# Patient Record
Sex: Male | Born: 1940 | Race: Black or African American | Hispanic: No | Marital: Single
Health system: Southern US, Community
[De-identification: ages and names within clinical notes are randomized; demographics above are authoritative.]

---

## 2015-02-17 DIAGNOSIS — I10 Essential (primary) hypertension: Secondary | ICD-10-CM | POA: Diagnosis not present

## 2015-02-17 DIAGNOSIS — R69 Illness, unspecified: Secondary | ICD-10-CM | POA: Diagnosis not present

## 2015-02-17 DIAGNOSIS — Z23 Encounter for immunization: Secondary | ICD-10-CM | POA: Diagnosis not present

## 2015-05-19 DIAGNOSIS — I10 Essential (primary) hypertension: Secondary | ICD-10-CM | POA: Diagnosis not present

## 2015-08-18 DIAGNOSIS — N182 Chronic kidney disease, stage 2 (mild): Secondary | ICD-10-CM | POA: Diagnosis not present

## 2015-08-18 DIAGNOSIS — R739 Hyperglycemia, unspecified: Secondary | ICD-10-CM | POA: Diagnosis not present

## 2015-08-18 DIAGNOSIS — R69 Illness, unspecified: Secondary | ICD-10-CM | POA: Diagnosis not present

## 2015-08-18 DIAGNOSIS — C61 Malignant neoplasm of prostate: Secondary | ICD-10-CM | POA: Diagnosis not present

## 2015-08-18 DIAGNOSIS — I10 Essential (primary) hypertension: Secondary | ICD-10-CM | POA: Diagnosis not present

## 2015-11-02 DIAGNOSIS — Z23 Encounter for immunization: Secondary | ICD-10-CM | POA: Diagnosis not present

## 2015-11-02 DIAGNOSIS — R739 Hyperglycemia, unspecified: Secondary | ICD-10-CM | POA: Diagnosis not present

## 2015-11-02 DIAGNOSIS — Z0000 Encounter for general adult medical examination without abnormal findings: Secondary | ICD-10-CM | POA: Diagnosis not present

## 2015-11-02 DIAGNOSIS — R69 Illness, unspecified: Secondary | ICD-10-CM | POA: Diagnosis not present

## 2015-11-02 DIAGNOSIS — C61 Malignant neoplasm of prostate: Secondary | ICD-10-CM | POA: Diagnosis not present

## 2015-11-02 DIAGNOSIS — Z0001 Encounter for general adult medical examination with abnormal findings: Secondary | ICD-10-CM | POA: Diagnosis not present

## 2015-11-02 DIAGNOSIS — I10 Essential (primary) hypertension: Secondary | ICD-10-CM | POA: Diagnosis not present

## 2015-11-03 DIAGNOSIS — T464X5A Adverse effect of angiotensin-converting-enzyme inhibitors, initial encounter: Secondary | ICD-10-CM | POA: Diagnosis not present

## 2015-11-03 DIAGNOSIS — R918 Other nonspecific abnormal finding of lung field: Secondary | ICD-10-CM | POA: Diagnosis not present

## 2015-11-03 DIAGNOSIS — D72829 Elevated white blood cell count, unspecified: Secondary | ICD-10-CM | POA: Diagnosis not present

## 2015-11-03 DIAGNOSIS — Z1211 Encounter for screening for malignant neoplasm of colon: Secondary | ICD-10-CM | POA: Diagnosis not present

## 2015-11-03 DIAGNOSIS — E875 Hyperkalemia: Secondary | ICD-10-CM | POA: Diagnosis not present

## 2015-11-03 DIAGNOSIS — I10 Essential (primary) hypertension: Secondary | ICD-10-CM | POA: Diagnosis not present

## 2015-11-03 DIAGNOSIS — N179 Acute kidney failure, unspecified: Secondary | ICD-10-CM | POA: Diagnosis not present

## 2015-11-04 DIAGNOSIS — N179 Acute kidney failure, unspecified: Secondary | ICD-10-CM | POA: Diagnosis not present

## 2015-11-04 DIAGNOSIS — E876 Hypokalemia: Secondary | ICD-10-CM | POA: Diagnosis not present

## 2015-11-04 DIAGNOSIS — D72829 Elevated white blood cell count, unspecified: Secondary | ICD-10-CM | POA: Diagnosis not present

## 2015-11-05 DIAGNOSIS — I10 Essential (primary) hypertension: Secondary | ICD-10-CM | POA: Diagnosis not present

## 2015-11-05 DIAGNOSIS — N179 Acute kidney failure, unspecified: Secondary | ICD-10-CM | POA: Diagnosis not present

## 2015-11-05 DIAGNOSIS — E875 Hyperkalemia: Secondary | ICD-10-CM | POA: Diagnosis not present

## 2015-11-07 DIAGNOSIS — E876 Hypokalemia: Secondary | ICD-10-CM | POA: Diagnosis not present

## 2015-11-08 DIAGNOSIS — E875 Hyperkalemia: Secondary | ICD-10-CM | POA: Diagnosis not present

## 2015-11-08 DIAGNOSIS — N183 Chronic kidney disease, stage 3 (moderate): Secondary | ICD-10-CM | POA: Diagnosis not present

## 2015-11-08 DIAGNOSIS — I10 Essential (primary) hypertension: Secondary | ICD-10-CM | POA: Diagnosis not present

## 2015-11-18 DIAGNOSIS — N183 Chronic kidney disease, stage 3 (moderate): Secondary | ICD-10-CM | POA: Diagnosis not present

## 2015-11-18 DIAGNOSIS — D509 Iron deficiency anemia, unspecified: Secondary | ICD-10-CM | POA: Diagnosis not present

## 2015-11-18 DIAGNOSIS — Z79899 Other long term (current) drug therapy: Secondary | ICD-10-CM | POA: Diagnosis not present

## 2015-11-22 DIAGNOSIS — N179 Acute kidney failure, unspecified: Secondary | ICD-10-CM | POA: Diagnosis not present

## 2015-11-22 DIAGNOSIS — D649 Anemia, unspecified: Secondary | ICD-10-CM | POA: Diagnosis not present

## 2015-11-22 DIAGNOSIS — E875 Hyperkalemia: Secondary | ICD-10-CM | POA: Diagnosis not present

## 2015-11-22 DIAGNOSIS — I10 Essential (primary) hypertension: Secondary | ICD-10-CM | POA: Diagnosis not present

## 2015-12-06 DIAGNOSIS — C61 Malignant neoplasm of prostate: Secondary | ICD-10-CM | POA: Diagnosis not present

## 2015-12-06 DIAGNOSIS — R69 Illness, unspecified: Secondary | ICD-10-CM | POA: Diagnosis not present

## 2015-12-06 DIAGNOSIS — I10 Essential (primary) hypertension: Secondary | ICD-10-CM | POA: Diagnosis not present

## 2015-12-06 DIAGNOSIS — N183 Chronic kidney disease, stage 3 (moderate): Secondary | ICD-10-CM | POA: Diagnosis not present

## 2015-12-06 DIAGNOSIS — R739 Hyperglycemia, unspecified: Secondary | ICD-10-CM | POA: Diagnosis not present

## 2015-12-06 DIAGNOSIS — Z0000 Encounter for general adult medical examination without abnormal findings: Secondary | ICD-10-CM | POA: Diagnosis not present

## 2015-12-12 DIAGNOSIS — I129 Hypertensive chronic kidney disease with stage 1 through stage 4 chronic kidney disease, or unspecified chronic kidney disease: Secondary | ICD-10-CM | POA: Diagnosis not present

## 2015-12-12 DIAGNOSIS — D509 Iron deficiency anemia, unspecified: Secondary | ICD-10-CM | POA: Diagnosis not present

## 2015-12-12 DIAGNOSIS — N183 Chronic kidney disease, stage 3 (moderate): Secondary | ICD-10-CM | POA: Diagnosis not present

## 2015-12-12 DIAGNOSIS — E559 Vitamin D deficiency, unspecified: Secondary | ICD-10-CM | POA: Diagnosis not present

## 2015-12-12 DIAGNOSIS — N189 Chronic kidney disease, unspecified: Secondary | ICD-10-CM | POA: Diagnosis not present

## 2015-12-12 DIAGNOSIS — R809 Proteinuria, unspecified: Secondary | ICD-10-CM | POA: Diagnosis not present

## 2015-12-12 DIAGNOSIS — Z1159 Encounter for screening for other viral diseases: Secondary | ICD-10-CM | POA: Diagnosis not present

## 2015-12-12 DIAGNOSIS — R1901 Right upper quadrant abdominal swelling, mass and lump: Secondary | ICD-10-CM | POA: Diagnosis not present

## 2015-12-12 DIAGNOSIS — Z79899 Other long term (current) drug therapy: Secondary | ICD-10-CM | POA: Diagnosis not present

## 2015-12-27 DIAGNOSIS — E872 Acidosis: Secondary | ICD-10-CM | POA: Diagnosis not present

## 2015-12-27 DIAGNOSIS — E875 Hyperkalemia: Secondary | ICD-10-CM | POA: Diagnosis not present

## 2015-12-27 DIAGNOSIS — I10 Essential (primary) hypertension: Secondary | ICD-10-CM | POA: Diagnosis not present

## 2015-12-27 DIAGNOSIS — N183 Chronic kidney disease, stage 3 (moderate): Secondary | ICD-10-CM | POA: Diagnosis not present

## 2016-01-19 DIAGNOSIS — R3129 Other microscopic hematuria: Secondary | ICD-10-CM | POA: Diagnosis not present

## 2016-01-19 DIAGNOSIS — N281 Cyst of kidney, acquired: Secondary | ICD-10-CM | POA: Diagnosis not present

## 2016-01-19 DIAGNOSIS — R69 Illness, unspecified: Secondary | ICD-10-CM | POA: Diagnosis not present

## 2016-01-19 DIAGNOSIS — R1901 Right upper quadrant abdominal swelling, mass and lump: Secondary | ICD-10-CM | POA: Diagnosis not present

## 2016-01-25 DIAGNOSIS — N281 Cyst of kidney, acquired: Secondary | ICD-10-CM | POA: Diagnosis not present

## 2016-01-25 DIAGNOSIS — D3501 Benign neoplasm of right adrenal gland: Secondary | ICD-10-CM | POA: Diagnosis not present

## 2016-01-25 DIAGNOSIS — R918 Other nonspecific abnormal finding of lung field: Secondary | ICD-10-CM | POA: Diagnosis not present

## 2016-01-25 DIAGNOSIS — D3502 Benign neoplasm of left adrenal gland: Secondary | ICD-10-CM | POA: Diagnosis not present

## 2016-01-27 DIAGNOSIS — R8290 Unspecified abnormal findings in urine: Secondary | ICD-10-CM | POA: Diagnosis not present

## 2016-01-27 DIAGNOSIS — R1901 Right upper quadrant abdominal swelling, mass and lump: Secondary | ICD-10-CM | POA: Diagnosis not present

## 2016-01-27 DIAGNOSIS — R3129 Other microscopic hematuria: Secondary | ICD-10-CM | POA: Diagnosis not present

## 2016-01-27 DIAGNOSIS — D494 Neoplasm of unspecified behavior of bladder: Secondary | ICD-10-CM | POA: Diagnosis not present

## 2016-01-30 DIAGNOSIS — R222 Localized swelling, mass and lump, trunk: Secondary | ICD-10-CM | POA: Diagnosis not present

## 2016-02-01 DIAGNOSIS — R69 Illness, unspecified: Secondary | ICD-10-CM | POA: Diagnosis not present

## 2016-02-01 DIAGNOSIS — I10 Essential (primary) hypertension: Secondary | ICD-10-CM | POA: Diagnosis not present

## 2016-02-01 DIAGNOSIS — Z0000 Encounter for general adult medical examination without abnormal findings: Secondary | ICD-10-CM | POA: Diagnosis not present

## 2016-02-01 DIAGNOSIS — N183 Chronic kidney disease, stage 3 (moderate): Secondary | ICD-10-CM | POA: Diagnosis not present

## 2016-02-01 DIAGNOSIS — R739 Hyperglycemia, unspecified: Secondary | ICD-10-CM | POA: Diagnosis not present

## 2016-02-01 DIAGNOSIS — C61 Malignant neoplasm of prostate: Secondary | ICD-10-CM | POA: Diagnosis not present

## 2016-02-02 DIAGNOSIS — I129 Hypertensive chronic kidney disease with stage 1 through stage 4 chronic kidney disease, or unspecified chronic kidney disease: Secondary | ICD-10-CM | POA: Diagnosis not present

## 2016-02-02 DIAGNOSIS — R69 Illness, unspecified: Secondary | ICD-10-CM | POA: Diagnosis not present

## 2016-02-02 DIAGNOSIS — N189 Chronic kidney disease, unspecified: Secondary | ICD-10-CM | POA: Diagnosis not present

## 2016-02-02 DIAGNOSIS — F17200 Nicotine dependence, unspecified, uncomplicated: Secondary | ICD-10-CM | POA: Diagnosis not present

## 2016-02-02 DIAGNOSIS — Z79899 Other long term (current) drug therapy: Secondary | ICD-10-CM | POA: Diagnosis not present

## 2016-02-02 DIAGNOSIS — E870 Hyperosmolality and hypernatremia: Secondary | ICD-10-CM | POA: Diagnosis not present

## 2016-02-09 DIAGNOSIS — D3614 Benign neoplasm of peripheral nerves and autonomic nervous system of thorax: Secondary | ICD-10-CM | POA: Diagnosis not present

## 2016-02-09 DIAGNOSIS — D494 Neoplasm of unspecified behavior of bladder: Secondary | ICD-10-CM | POA: Diagnosis not present

## 2016-02-09 DIAGNOSIS — C672 Malignant neoplasm of lateral wall of bladder: Secondary | ICD-10-CM | POA: Diagnosis not present

## 2016-02-09 DIAGNOSIS — C679 Malignant neoplasm of bladder, unspecified: Secondary | ICD-10-CM | POA: Diagnosis not present

## 2016-02-09 DIAGNOSIS — R3129 Other microscopic hematuria: Secondary | ICD-10-CM | POA: Diagnosis not present

## 2016-02-09 DIAGNOSIS — R222 Localized swelling, mass and lump, trunk: Secondary | ICD-10-CM | POA: Diagnosis not present

## 2016-02-09 DIAGNOSIS — D485 Neoplasm of uncertain behavior of skin: Secondary | ICD-10-CM | POA: Diagnosis not present

## 2016-02-09 DIAGNOSIS — I10 Essential (primary) hypertension: Secondary | ICD-10-CM | POA: Diagnosis not present

## 2016-02-09 DIAGNOSIS — R69 Illness, unspecified: Secondary | ICD-10-CM | POA: Diagnosis not present

## 2016-02-09 DIAGNOSIS — D213 Benign neoplasm of connective and other soft tissue of thorax: Secondary | ICD-10-CM | POA: Diagnosis not present

## 2016-02-09 DIAGNOSIS — Z79899 Other long term (current) drug therapy: Secondary | ICD-10-CM | POA: Diagnosis not present

## 2016-03-07 DIAGNOSIS — C672 Malignant neoplasm of lateral wall of bladder: Secondary | ICD-10-CM | POA: Diagnosis not present

## 2016-03-07 DIAGNOSIS — D494 Neoplasm of unspecified behavior of bladder: Secondary | ICD-10-CM | POA: Diagnosis not present

## 2016-03-07 DIAGNOSIS — I10 Essential (primary) hypertension: Secondary | ICD-10-CM | POA: Diagnosis not present

## 2016-03-07 DIAGNOSIS — Z79899 Other long term (current) drug therapy: Secondary | ICD-10-CM | POA: Diagnosis not present

## 2016-03-07 DIAGNOSIS — N3090 Cystitis, unspecified without hematuria: Secondary | ICD-10-CM | POA: Diagnosis not present

## 2016-03-07 DIAGNOSIS — R69 Illness, unspecified: Secondary | ICD-10-CM | POA: Diagnosis not present

## 2016-03-22 DIAGNOSIS — C672 Malignant neoplasm of lateral wall of bladder: Secondary | ICD-10-CM | POA: Diagnosis not present

## 2016-03-26 DIAGNOSIS — T8131XA Disruption of external operation (surgical) wound, not elsewhere classified, initial encounter: Secondary | ICD-10-CM | POA: Diagnosis not present

## 2016-03-26 DIAGNOSIS — D3610 Benign neoplasm of peripheral nerves and autonomic nervous system, unspecified: Secondary | ICD-10-CM | POA: Diagnosis not present

## 2016-03-29 DIAGNOSIS — Z79899 Other long term (current) drug therapy: Secondary | ICD-10-CM | POA: Diagnosis not present

## 2016-03-29 DIAGNOSIS — Z8249 Family history of ischemic heart disease and other diseases of the circulatory system: Secondary | ICD-10-CM | POA: Diagnosis not present

## 2016-03-29 DIAGNOSIS — T8131XA Disruption of external operation (surgical) wound, not elsewhere classified, initial encounter: Secondary | ICD-10-CM | POA: Diagnosis not present

## 2016-03-29 DIAGNOSIS — D3610 Benign neoplasm of peripheral nerves and autonomic nervous system, unspecified: Secondary | ICD-10-CM | POA: Diagnosis not present

## 2016-03-29 DIAGNOSIS — I10 Essential (primary) hypertension: Secondary | ICD-10-CM | POA: Diagnosis not present

## 2016-03-29 DIAGNOSIS — R69 Illness, unspecified: Secondary | ICD-10-CM | POA: Diagnosis not present

## 2016-03-29 DIAGNOSIS — Z8551 Personal history of malignant neoplasm of bladder: Secondary | ICD-10-CM | POA: Diagnosis not present

## 2016-03-29 DIAGNOSIS — Y838 Other surgical procedures as the cause of abnormal reaction of the patient, or of later complication, without mention of misadventure at the time of the procedure: Secondary | ICD-10-CM | POA: Diagnosis not present

## 2016-03-30 DIAGNOSIS — S2190XA Unspecified open wound of unspecified part of thorax, initial encounter: Secondary | ICD-10-CM | POA: Diagnosis not present

## 2016-03-30 DIAGNOSIS — T8131XA Disruption of external operation (surgical) wound, not elsewhere classified, initial encounter: Secondary | ICD-10-CM | POA: Diagnosis not present

## 2016-04-11 DIAGNOSIS — Z0000 Encounter for general adult medical examination without abnormal findings: Secondary | ICD-10-CM | POA: Diagnosis not present

## 2016-04-11 DIAGNOSIS — I10 Essential (primary) hypertension: Secondary | ICD-10-CM | POA: Diagnosis not present

## 2016-04-11 DIAGNOSIS — Z6821 Body mass index (BMI) 21.0-21.9, adult: Secondary | ICD-10-CM | POA: Diagnosis not present

## 2016-04-11 DIAGNOSIS — R69 Illness, unspecified: Secondary | ICD-10-CM | POA: Diagnosis not present

## 2016-04-11 DIAGNOSIS — Z972 Presence of dental prosthetic device (complete) (partial): Secondary | ICD-10-CM | POA: Diagnosis not present

## 2016-05-04 DIAGNOSIS — N183 Chronic kidney disease, stage 3 (moderate): Secondary | ICD-10-CM | POA: Diagnosis not present

## 2016-05-04 DIAGNOSIS — I10 Essential (primary) hypertension: Secondary | ICD-10-CM | POA: Diagnosis not present

## 2016-05-04 DIAGNOSIS — C61 Malignant neoplasm of prostate: Secondary | ICD-10-CM | POA: Diagnosis not present

## 2016-05-04 DIAGNOSIS — R739 Hyperglycemia, unspecified: Secondary | ICD-10-CM | POA: Diagnosis not present

## 2016-05-04 DIAGNOSIS — R69 Illness, unspecified: Secondary | ICD-10-CM | POA: Diagnosis not present

## 2016-05-04 DIAGNOSIS — Z0000 Encounter for general adult medical examination without abnormal findings: Secondary | ICD-10-CM | POA: Diagnosis not present

## 2016-06-26 ENCOUNTER — Ambulatory Visit: Payer: Self-pay | Admitting: Urology

## 2016-08-03 DIAGNOSIS — I10 Essential (primary) hypertension: Secondary | ICD-10-CM | POA: Diagnosis not present

## 2016-08-03 DIAGNOSIS — N183 Chronic kidney disease, stage 3 (moderate): Secondary | ICD-10-CM | POA: Diagnosis not present

## 2016-10-30 DIAGNOSIS — Z1211 Encounter for screening for malignant neoplasm of colon: Secondary | ICD-10-CM | POA: Diagnosis not present

## 2016-11-02 DIAGNOSIS — Z23 Encounter for immunization: Secondary | ICD-10-CM | POA: Diagnosis not present

## 2016-11-02 DIAGNOSIS — N183 Chronic kidney disease, stage 3 (moderate): Secondary | ICD-10-CM | POA: Diagnosis not present

## 2016-11-02 DIAGNOSIS — I10 Essential (primary) hypertension: Secondary | ICD-10-CM | POA: Diagnosis not present

## 2016-11-02 DIAGNOSIS — R69 Illness, unspecified: Secondary | ICD-10-CM | POA: Diagnosis not present

## 2017-02-01 DIAGNOSIS — I10 Essential (primary) hypertension: Secondary | ICD-10-CM | POA: Diagnosis not present

## 2017-02-01 DIAGNOSIS — R739 Hyperglycemia, unspecified: Secondary | ICD-10-CM | POA: Diagnosis not present

## 2017-02-01 DIAGNOSIS — C61 Malignant neoplasm of prostate: Secondary | ICD-10-CM | POA: Diagnosis not present

## 2017-02-01 DIAGNOSIS — Z0000 Encounter for general adult medical examination without abnormal findings: Secondary | ICD-10-CM | POA: Diagnosis not present

## 2017-02-01 DIAGNOSIS — Z0001 Encounter for general adult medical examination with abnormal findings: Secondary | ICD-10-CM | POA: Diagnosis not present

## 2017-02-01 DIAGNOSIS — R69 Illness, unspecified: Secondary | ICD-10-CM | POA: Diagnosis not present

## 2017-02-01 DIAGNOSIS — N183 Chronic kidney disease, stage 3 (moderate): Secondary | ICD-10-CM | POA: Diagnosis not present

## 2017-02-01 DIAGNOSIS — Z1389 Encounter for screening for other disorder: Secondary | ICD-10-CM | POA: Diagnosis not present

## 2017-05-14 DIAGNOSIS — R69 Illness, unspecified: Secondary | ICD-10-CM | POA: Diagnosis not present

## 2017-05-14 DIAGNOSIS — I10 Essential (primary) hypertension: Secondary | ICD-10-CM | POA: Diagnosis not present

## 2017-05-14 DIAGNOSIS — N183 Chronic kidney disease, stage 3 (moderate): Secondary | ICD-10-CM | POA: Diagnosis not present

## 2017-05-17 DIAGNOSIS — I10 Essential (primary) hypertension: Secondary | ICD-10-CM | POA: Diagnosis not present

## 2017-05-28 DIAGNOSIS — I10 Essential (primary) hypertension: Secondary | ICD-10-CM | POA: Diagnosis not present

## 2017-05-28 DIAGNOSIS — J309 Allergic rhinitis, unspecified: Secondary | ICD-10-CM | POA: Diagnosis not present

## 2017-05-28 DIAGNOSIS — R69 Illness, unspecified: Secondary | ICD-10-CM | POA: Diagnosis not present

## 2017-05-28 DIAGNOSIS — H269 Unspecified cataract: Secondary | ICD-10-CM | POA: Diagnosis not present

## 2017-05-28 DIAGNOSIS — K08109 Complete loss of teeth, unspecified cause, unspecified class: Secondary | ICD-10-CM | POA: Diagnosis not present

## 2017-05-28 DIAGNOSIS — Z8551 Personal history of malignant neoplasm of bladder: Secondary | ICD-10-CM | POA: Diagnosis not present

## 2017-05-28 DIAGNOSIS — Z8249 Family history of ischemic heart disease and other diseases of the circulatory system: Secondary | ICD-10-CM | POA: Diagnosis not present

## 2017-08-15 DIAGNOSIS — N183 Chronic kidney disease, stage 3 (moderate): Secondary | ICD-10-CM | POA: Diagnosis not present

## 2017-08-15 DIAGNOSIS — I10 Essential (primary) hypertension: Secondary | ICD-10-CM | POA: Diagnosis not present

## 2017-08-15 DIAGNOSIS — R69 Illness, unspecified: Secondary | ICD-10-CM | POA: Diagnosis not present

## 2017-08-15 DIAGNOSIS — E7800 Pure hypercholesterolemia, unspecified: Secondary | ICD-10-CM | POA: Diagnosis not present

## 2017-11-15 DIAGNOSIS — R69 Illness, unspecified: Secondary | ICD-10-CM | POA: Diagnosis not present

## 2017-11-15 DIAGNOSIS — Z23 Encounter for immunization: Secondary | ICD-10-CM | POA: Diagnosis not present

## 2017-11-15 DIAGNOSIS — I10 Essential (primary) hypertension: Secondary | ICD-10-CM | POA: Diagnosis not present

## 2017-11-15 DIAGNOSIS — E7800 Pure hypercholesterolemia, unspecified: Secondary | ICD-10-CM | POA: Diagnosis not present

## 2017-11-15 DIAGNOSIS — N183 Chronic kidney disease, stage 3 (moderate): Secondary | ICD-10-CM | POA: Diagnosis not present

## 2017-12-02 DIAGNOSIS — H25013 Cortical age-related cataract, bilateral: Secondary | ICD-10-CM | POA: Diagnosis not present

## 2017-12-02 DIAGNOSIS — H52203 Unspecified astigmatism, bilateral: Secondary | ICD-10-CM | POA: Diagnosis not present

## 2017-12-02 DIAGNOSIS — H5213 Myopia, bilateral: Secondary | ICD-10-CM | POA: Diagnosis not present

## 2017-12-02 DIAGNOSIS — H2513 Age-related nuclear cataract, bilateral: Secondary | ICD-10-CM | POA: Diagnosis not present

## 2017-12-02 DIAGNOSIS — H524 Presbyopia: Secondary | ICD-10-CM | POA: Diagnosis not present

## 2018-02-14 DIAGNOSIS — C61 Malignant neoplasm of prostate: Secondary | ICD-10-CM | POA: Diagnosis not present

## 2018-02-14 DIAGNOSIS — Z1331 Encounter for screening for depression: Secondary | ICD-10-CM | POA: Diagnosis not present

## 2018-02-14 DIAGNOSIS — Z1389 Encounter for screening for other disorder: Secondary | ICD-10-CM | POA: Diagnosis not present

## 2018-02-14 DIAGNOSIS — I10 Essential (primary) hypertension: Secondary | ICD-10-CM | POA: Diagnosis not present

## 2018-02-14 DIAGNOSIS — N183 Chronic kidney disease, stage 3 (moderate): Secondary | ICD-10-CM | POA: Diagnosis not present

## 2018-02-14 DIAGNOSIS — R739 Hyperglycemia, unspecified: Secondary | ICD-10-CM | POA: Diagnosis not present

## 2018-02-14 DIAGNOSIS — Z0000 Encounter for general adult medical examination without abnormal findings: Secondary | ICD-10-CM | POA: Diagnosis not present

## 2018-02-14 DIAGNOSIS — R69 Illness, unspecified: Secondary | ICD-10-CM | POA: Diagnosis not present

## 2018-02-14 DIAGNOSIS — Z0001 Encounter for general adult medical examination with abnormal findings: Secondary | ICD-10-CM | POA: Diagnosis not present

## 2018-04-21 DIAGNOSIS — Z8249 Family history of ischemic heart disease and other diseases of the circulatory system: Secondary | ICD-10-CM | POA: Diagnosis not present

## 2018-04-21 DIAGNOSIS — Z8551 Personal history of malignant neoplasm of bladder: Secondary | ICD-10-CM | POA: Diagnosis not present

## 2018-04-21 DIAGNOSIS — R69 Illness, unspecified: Secondary | ICD-10-CM | POA: Diagnosis not present

## 2018-04-21 DIAGNOSIS — E785 Hyperlipidemia, unspecified: Secondary | ICD-10-CM | POA: Diagnosis not present

## 2018-04-21 DIAGNOSIS — I10 Essential (primary) hypertension: Secondary | ICD-10-CM | POA: Diagnosis not present

## 2018-05-16 DIAGNOSIS — I10 Essential (primary) hypertension: Secondary | ICD-10-CM | POA: Diagnosis not present

## 2018-05-16 DIAGNOSIS — E785 Hyperlipidemia, unspecified: Secondary | ICD-10-CM | POA: Diagnosis not present

## 2018-05-16 DIAGNOSIS — N183 Chronic kidney disease, stage 3 (moderate): Secondary | ICD-10-CM | POA: Diagnosis not present

## 2018-08-13 DIAGNOSIS — N183 Chronic kidney disease, stage 3 (moderate): Secondary | ICD-10-CM | POA: Diagnosis not present

## 2018-08-13 DIAGNOSIS — I10 Essential (primary) hypertension: Secondary | ICD-10-CM | POA: Diagnosis not present

## 2018-08-13 DIAGNOSIS — E785 Hyperlipidemia, unspecified: Secondary | ICD-10-CM | POA: Diagnosis not present

## 2018-10-06 DIAGNOSIS — I10 Essential (primary) hypertension: Secondary | ICD-10-CM | POA: Diagnosis not present

## 2018-10-06 DIAGNOSIS — R69 Illness, unspecified: Secondary | ICD-10-CM | POA: Diagnosis not present

## 2018-10-06 DIAGNOSIS — N183 Chronic kidney disease, stage 3 (moderate): Secondary | ICD-10-CM | POA: Diagnosis not present

## 2018-10-06 DIAGNOSIS — E785 Hyperlipidemia, unspecified: Secondary | ICD-10-CM | POA: Diagnosis not present

## 2018-10-07 DIAGNOSIS — N183 Chronic kidney disease, stage 3 (moderate): Secondary | ICD-10-CM | POA: Diagnosis not present

## 2018-10-07 DIAGNOSIS — I129 Hypertensive chronic kidney disease with stage 1 through stage 4 chronic kidney disease, or unspecified chronic kidney disease: Secondary | ICD-10-CM | POA: Diagnosis not present

## 2018-11-06 DIAGNOSIS — I10 Essential (primary) hypertension: Secondary | ICD-10-CM | POA: Diagnosis not present

## 2018-11-06 DIAGNOSIS — N183 Chronic kidney disease, stage 3 (moderate): Secondary | ICD-10-CM | POA: Diagnosis not present

## 2018-11-11 DIAGNOSIS — R69 Illness, unspecified: Secondary | ICD-10-CM | POA: Diagnosis not present

## 2018-12-04 DIAGNOSIS — I10 Essential (primary) hypertension: Secondary | ICD-10-CM | POA: Diagnosis not present

## 2018-12-04 DIAGNOSIS — N1831 Chronic kidney disease, stage 3a: Secondary | ICD-10-CM | POA: Diagnosis not present

## 2019-01-04 DIAGNOSIS — N183 Chronic kidney disease, stage 3 unspecified: Secondary | ICD-10-CM | POA: Diagnosis not present

## 2019-01-04 DIAGNOSIS — E785 Hyperlipidemia, unspecified: Secondary | ICD-10-CM | POA: Diagnosis not present

## 2019-01-23 DIAGNOSIS — Z1331 Encounter for screening for depression: Secondary | ICD-10-CM | POA: Diagnosis not present

## 2019-01-23 DIAGNOSIS — Z0001 Encounter for general adult medical examination with abnormal findings: Secondary | ICD-10-CM | POA: Diagnosis not present

## 2019-01-23 DIAGNOSIS — N1831 Chronic kidney disease, stage 3a: Secondary | ICD-10-CM | POA: Diagnosis not present

## 2019-01-23 DIAGNOSIS — I10 Essential (primary) hypertension: Secondary | ICD-10-CM | POA: Diagnosis not present

## 2019-01-23 DIAGNOSIS — Z1389 Encounter for screening for other disorder: Secondary | ICD-10-CM | POA: Diagnosis not present

## 2019-01-23 DIAGNOSIS — R69 Illness, unspecified: Secondary | ICD-10-CM | POA: Diagnosis not present

## 2019-01-23 DIAGNOSIS — E785 Hyperlipidemia, unspecified: Secondary | ICD-10-CM | POA: Diagnosis not present

## 2019-01-29 DIAGNOSIS — Z0001 Encounter for general adult medical examination with abnormal findings: Secondary | ICD-10-CM | POA: Diagnosis not present

## 2019-01-29 DIAGNOSIS — E785 Hyperlipidemia, unspecified: Secondary | ICD-10-CM | POA: Diagnosis not present

## 2019-01-29 DIAGNOSIS — I10 Essential (primary) hypertension: Secondary | ICD-10-CM | POA: Diagnosis not present

## 2019-01-29 DIAGNOSIS — N185 Chronic kidney disease, stage 5: Secondary | ICD-10-CM | POA: Diagnosis not present

## 2019-02-23 DIAGNOSIS — N183 Chronic kidney disease, stage 3 unspecified: Secondary | ICD-10-CM | POA: Diagnosis not present

## 2019-02-23 DIAGNOSIS — E785 Hyperlipidemia, unspecified: Secondary | ICD-10-CM | POA: Diagnosis not present

## 2019-03-26 DIAGNOSIS — N1831 Chronic kidney disease, stage 3a: Secondary | ICD-10-CM | POA: Diagnosis not present

## 2019-03-26 DIAGNOSIS — E785 Hyperlipidemia, unspecified: Secondary | ICD-10-CM | POA: Diagnosis not present

## 2019-04-02 ENCOUNTER — Ambulatory Visit: Payer: Medicare HMO | Attending: Internal Medicine

## 2019-04-02 ENCOUNTER — Other Ambulatory Visit: Payer: Self-pay

## 2019-04-02 DIAGNOSIS — Z23 Encounter for immunization: Secondary | ICD-10-CM | POA: Insufficient documentation

## 2019-04-02 NOTE — Progress Notes (Signed)
   Covid-19 Vaccination Clinic  Name:  Noah Nguyen    MRN: [id number] DOB: December 15, 1940  04/02/2019  [name] was observed post Covid-19 immunization for 15 minutes without incidence. He was provided with Vaccine Information Sheet and instruction to access the V-Safe system.   [name] was instructed to call 911 with any severe reactions post vaccine: Marland Kitchen Difficulty breathing  . Swelling of your face and throat  . A fast heartbeat  . A bad rash all over your body  . Dizziness and weakness    Immunizations Administered    Name Date Dose VIS Date Route   Moderna COVID-19 Vaccine 04/02/2019 10:41 AM 0.5 mL 01/20/2019 Intramuscular   Manufacturer: Moderna   Lot: [id number]   [place]: 12379-909-40

## 2019-04-23 DIAGNOSIS — I10 Essential (primary) hypertension: Secondary | ICD-10-CM | POA: Diagnosis not present

## 2019-04-23 DIAGNOSIS — N183 Chronic kidney disease, stage 3 unspecified: Secondary | ICD-10-CM | POA: Diagnosis not present

## 2019-05-04 ENCOUNTER — Ambulatory Visit: Payer: Medicare HMO | Attending: Internal Medicine

## 2019-05-04 DIAGNOSIS — Z23 Encounter for immunization: Secondary | ICD-10-CM

## 2019-05-04 NOTE — Progress Notes (Signed)
   Covid-19 Vaccination Clinic  Name:  Noah Nguyen    MRN: [id number] DOB: 02-Dec-1940  05/04/2019  Mr. Coven was observed post Covid-19 immunization for 15 minutes without incident. He was provided with Vaccine Information Sheet and instruction to access the V-Safe system.   Mr. Persichetti was instructed to call 911 with any severe reactions post vaccine: Marland Kitchen Difficulty breathing  . Swelling of face and throat  . A fast heartbeat  . A bad rash all over body  . Dizziness and weakness   Immunizations Administered    Name Date Dose VIS Date Route   Moderna COVID-19 Vaccine 05/04/2019 10:03 AM 0.5 mL 01/20/2019 Intramuscular   Manufacturer: Moderna   Lot: [id number]   [place]: 58727-618-48

## 2019-05-24 DIAGNOSIS — E785 Hyperlipidemia, unspecified: Secondary | ICD-10-CM | POA: Diagnosis not present

## 2019-05-24 DIAGNOSIS — I10 Essential (primary) hypertension: Secondary | ICD-10-CM | POA: Diagnosis not present

## 2019-06-23 DIAGNOSIS — N183 Chronic kidney disease, stage 3 unspecified: Secondary | ICD-10-CM | POA: Diagnosis not present

## 2019-06-23 DIAGNOSIS — E785 Hyperlipidemia, unspecified: Secondary | ICD-10-CM | POA: Diagnosis not present

## 2019-07-23 DIAGNOSIS — E785 Hyperlipidemia, unspecified: Secondary | ICD-10-CM | POA: Diagnosis not present

## 2019-07-23 DIAGNOSIS — N1831 Chronic kidney disease, stage 3a: Secondary | ICD-10-CM | POA: Diagnosis not present

## 2019-07-23 DIAGNOSIS — R69 Illness, unspecified: Secondary | ICD-10-CM | POA: Diagnosis not present

## 2019-07-23 DIAGNOSIS — I10 Essential (primary) hypertension: Secondary | ICD-10-CM | POA: Diagnosis not present

## 2019-08-22 DIAGNOSIS — I10 Essential (primary) hypertension: Secondary | ICD-10-CM | POA: Diagnosis not present

## 2019-08-22 DIAGNOSIS — E785 Hyperlipidemia, unspecified: Secondary | ICD-10-CM | POA: Diagnosis not present

## 2019-09-22 DIAGNOSIS — I10 Essential (primary) hypertension: Secondary | ICD-10-CM | POA: Diagnosis not present

## 2019-09-22 DIAGNOSIS — N1831 Chronic kidney disease, stage 3a: Secondary | ICD-10-CM | POA: Diagnosis not present

## 2019-10-23 DIAGNOSIS — I10 Essential (primary) hypertension: Secondary | ICD-10-CM | POA: Diagnosis not present

## 2019-10-23 DIAGNOSIS — N183 Chronic kidney disease, stage 3 unspecified: Secondary | ICD-10-CM | POA: Diagnosis not present

## 2019-11-22 DIAGNOSIS — I10 Essential (primary) hypertension: Secondary | ICD-10-CM | POA: Diagnosis not present

## 2019-11-22 DIAGNOSIS — E785 Hyperlipidemia, unspecified: Secondary | ICD-10-CM | POA: Diagnosis not present

## 2019-11-25 DIAGNOSIS — Z23 Encounter for immunization: Secondary | ICD-10-CM | POA: Diagnosis not present

## 2019-12-23 DIAGNOSIS — N183 Chronic kidney disease, stage 3 unspecified: Secondary | ICD-10-CM | POA: Diagnosis not present

## 2019-12-23 DIAGNOSIS — I10 Essential (primary) hypertension: Secondary | ICD-10-CM | POA: Diagnosis not present

## 2020-01-26 DIAGNOSIS — I10 Essential (primary) hypertension: Secondary | ICD-10-CM | POA: Diagnosis not present

## 2020-01-26 DIAGNOSIS — R69 Illness, unspecified: Secondary | ICD-10-CM | POA: Diagnosis not present

## 2020-01-26 DIAGNOSIS — Z1331 Encounter for screening for depression: Secondary | ICD-10-CM | POA: Diagnosis not present

## 2020-01-26 DIAGNOSIS — Z0001 Encounter for general adult medical examination with abnormal findings: Secondary | ICD-10-CM | POA: Diagnosis not present

## 2020-01-26 DIAGNOSIS — N1831 Chronic kidney disease, stage 3a: Secondary | ICD-10-CM | POA: Diagnosis not present

## 2020-01-26 DIAGNOSIS — E785 Hyperlipidemia, unspecified: Secondary | ICD-10-CM | POA: Diagnosis not present

## 2020-01-26 DIAGNOSIS — Z1389 Encounter for screening for other disorder: Secondary | ICD-10-CM | POA: Diagnosis not present

## 2020-01-26 DIAGNOSIS — Z79899 Other long term (current) drug therapy: Secondary | ICD-10-CM | POA: Diagnosis not present

## 2020-02-26 DIAGNOSIS — E785 Hyperlipidemia, unspecified: Secondary | ICD-10-CM | POA: Diagnosis not present

## 2020-02-26 DIAGNOSIS — I10 Essential (primary) hypertension: Secondary | ICD-10-CM | POA: Diagnosis not present

## 2020-03-28 DIAGNOSIS — E785 Hyperlipidemia, unspecified: Secondary | ICD-10-CM | POA: Diagnosis not present

## 2020-03-28 DIAGNOSIS — I10 Essential (primary) hypertension: Secondary | ICD-10-CM | POA: Diagnosis not present

## 2020-04-25 DIAGNOSIS — E785 Hyperlipidemia, unspecified: Secondary | ICD-10-CM | POA: Diagnosis not present

## 2020-04-25 DIAGNOSIS — I10 Essential (primary) hypertension: Secondary | ICD-10-CM | POA: Diagnosis not present

## 2020-05-26 DIAGNOSIS — I10 Essential (primary) hypertension: Secondary | ICD-10-CM | POA: Diagnosis not present

## 2020-05-26 DIAGNOSIS — N1831 Chronic kidney disease, stage 3a: Secondary | ICD-10-CM | POA: Diagnosis not present

## 2020-06-25 DIAGNOSIS — N183 Chronic kidney disease, stage 3 unspecified: Secondary | ICD-10-CM | POA: Diagnosis not present

## 2020-06-25 DIAGNOSIS — I10 Essential (primary) hypertension: Secondary | ICD-10-CM | POA: Diagnosis not present

## 2020-07-26 DIAGNOSIS — N1831 Chronic kidney disease, stage 3a: Secondary | ICD-10-CM | POA: Diagnosis not present

## 2020-07-26 DIAGNOSIS — E785 Hyperlipidemia, unspecified: Secondary | ICD-10-CM | POA: Diagnosis not present

## 2020-07-26 DIAGNOSIS — I10 Essential (primary) hypertension: Secondary | ICD-10-CM | POA: Diagnosis not present

## 2020-08-25 DIAGNOSIS — N1831 Chronic kidney disease, stage 3a: Secondary | ICD-10-CM | POA: Diagnosis not present

## 2020-08-25 DIAGNOSIS — I10 Essential (primary) hypertension: Secondary | ICD-10-CM | POA: Diagnosis not present

## 2020-09-25 DIAGNOSIS — I10 Essential (primary) hypertension: Secondary | ICD-10-CM | POA: Diagnosis not present

## 2020-09-25 DIAGNOSIS — N183 Chronic kidney disease, stage 3 unspecified: Secondary | ICD-10-CM | POA: Diagnosis not present

## 2020-10-26 DIAGNOSIS — I10 Essential (primary) hypertension: Secondary | ICD-10-CM | POA: Diagnosis not present

## 2020-10-26 DIAGNOSIS — N183 Chronic kidney disease, stage 3 unspecified: Secondary | ICD-10-CM | POA: Diagnosis not present

## 2020-11-08 DIAGNOSIS — Z23 Encounter for immunization: Secondary | ICD-10-CM | POA: Diagnosis not present

## 2020-11-25 DIAGNOSIS — N183 Chronic kidney disease, stage 3 unspecified: Secondary | ICD-10-CM | POA: Diagnosis not present

## 2020-11-25 DIAGNOSIS — I10 Essential (primary) hypertension: Secondary | ICD-10-CM | POA: Diagnosis not present

## 2020-12-26 DIAGNOSIS — N183 Chronic kidney disease, stage 3 unspecified: Secondary | ICD-10-CM | POA: Diagnosis not present

## 2020-12-26 DIAGNOSIS — I10 Essential (primary) hypertension: Secondary | ICD-10-CM | POA: Diagnosis not present

## 2021-01-24 DIAGNOSIS — E785 Hyperlipidemia, unspecified: Secondary | ICD-10-CM | POA: Diagnosis not present

## 2021-01-24 DIAGNOSIS — I10 Essential (primary) hypertension: Secondary | ICD-10-CM | POA: Diagnosis not present

## 2021-01-24 DIAGNOSIS — Z1389 Encounter for screening for other disorder: Secondary | ICD-10-CM | POA: Diagnosis not present

## 2021-01-24 DIAGNOSIS — Z1331 Encounter for screening for depression: Secondary | ICD-10-CM | POA: Diagnosis not present

## 2021-01-24 DIAGNOSIS — N1831 Chronic kidney disease, stage 3a: Secondary | ICD-10-CM | POA: Diagnosis not present

## 2021-01-24 DIAGNOSIS — Z0001 Encounter for general adult medical examination with abnormal findings: Secondary | ICD-10-CM | POA: Diagnosis not present

## 2021-01-27 DIAGNOSIS — I10 Essential (primary) hypertension: Secondary | ICD-10-CM | POA: Diagnosis not present

## 2021-01-27 DIAGNOSIS — Z125 Encounter for screening for malignant neoplasm of prostate: Secondary | ICD-10-CM | POA: Diagnosis not present

## 2021-01-27 DIAGNOSIS — Z0001 Encounter for general adult medical examination with abnormal findings: Secondary | ICD-10-CM | POA: Diagnosis not present

## 2021-03-27 DIAGNOSIS — I10 Essential (primary) hypertension: Secondary | ICD-10-CM | POA: Diagnosis not present

## 2021-03-27 DIAGNOSIS — N1831 Chronic kidney disease, stage 3a: Secondary | ICD-10-CM | POA: Diagnosis not present

## 2021-03-27 DIAGNOSIS — N183 Chronic kidney disease, stage 3 unspecified: Secondary | ICD-10-CM | POA: Diagnosis not present

## 2021-03-27 DIAGNOSIS — I129 Hypertensive chronic kidney disease with stage 1 through stage 4 chronic kidney disease, or unspecified chronic kidney disease: Secondary | ICD-10-CM | POA: Diagnosis not present

## 2021-03-29 ENCOUNTER — Other Ambulatory Visit (HOSPITAL_COMMUNITY): Payer: Self-pay | Admitting: Nephrology

## 2021-03-29 ENCOUNTER — Other Ambulatory Visit: Payer: Self-pay | Admitting: Nephrology

## 2021-03-29 DIAGNOSIS — N1831 Chronic kidney disease, stage 3a: Secondary | ICD-10-CM

## 2021-04-05 ENCOUNTER — Ambulatory Visit (HOSPITAL_COMMUNITY)
Admission: RE | Admit: 2021-04-05 | Discharge: 2021-04-05 | Disposition: A | Discharge status: Home / Self Care | Payer: Medicare HMO | Source: Ambulatory Visit | Attending: Nephrology | Admitting: Nephrology

## 2021-04-05 ENCOUNTER — Other Ambulatory Visit: Payer: Self-pay

## 2021-04-05 DIAGNOSIS — N1831 Chronic kidney disease, stage 3a: Secondary | ICD-10-CM | POA: Diagnosis not present

## 2021-04-05 DIAGNOSIS — N281 Cyst of kidney, acquired: Secondary | ICD-10-CM | POA: Diagnosis not present

## 2021-04-24 DIAGNOSIS — I10 Essential (primary) hypertension: Secondary | ICD-10-CM | POA: Diagnosis not present

## 2021-04-24 DIAGNOSIS — N183 Chronic kidney disease, stage 3 unspecified: Secondary | ICD-10-CM | POA: Diagnosis not present

## 2021-05-25 DIAGNOSIS — N183 Chronic kidney disease, stage 3 unspecified: Secondary | ICD-10-CM | POA: Diagnosis not present

## 2021-05-25 DIAGNOSIS — I10 Essential (primary) hypertension: Secondary | ICD-10-CM | POA: Diagnosis not present

## 2021-06-24 DIAGNOSIS — N183 Chronic kidney disease, stage 3 unspecified: Secondary | ICD-10-CM | POA: Diagnosis not present

## 2021-06-24 DIAGNOSIS — I10 Essential (primary) hypertension: Secondary | ICD-10-CM | POA: Diagnosis not present

## 2021-08-15 DIAGNOSIS — N1832 Chronic kidney disease, stage 3b: Secondary | ICD-10-CM | POA: Diagnosis not present

## 2021-08-15 DIAGNOSIS — N183 Chronic kidney disease, stage 3 unspecified: Secondary | ICD-10-CM | POA: Diagnosis not present

## 2021-08-15 DIAGNOSIS — F17210 Nicotine dependence, cigarettes, uncomplicated: Secondary | ICD-10-CM | POA: Diagnosis not present

## 2021-08-15 DIAGNOSIS — F17200 Nicotine dependence, unspecified, uncomplicated: Secondary | ICD-10-CM | POA: Diagnosis not present

## 2021-08-15 DIAGNOSIS — E785 Hyperlipidemia, unspecified: Secondary | ICD-10-CM | POA: Diagnosis not present

## 2021-08-15 DIAGNOSIS — I10 Essential (primary) hypertension: Secondary | ICD-10-CM | POA: Diagnosis not present

## 2021-09-08 DIAGNOSIS — N1831 Chronic kidney disease, stage 3a: Secondary | ICD-10-CM | POA: Diagnosis not present

## 2021-09-14 DIAGNOSIS — E785 Hyperlipidemia, unspecified: Secondary | ICD-10-CM | POA: Diagnosis not present

## 2021-09-14 DIAGNOSIS — I10 Essential (primary) hypertension: Secondary | ICD-10-CM | POA: Diagnosis not present

## 2021-09-20 DIAGNOSIS — N1831 Chronic kidney disease, stage 3a: Secondary | ICD-10-CM | POA: Diagnosis not present

## 2021-09-20 DIAGNOSIS — I129 Hypertensive chronic kidney disease with stage 1 through stage 4 chronic kidney disease, or unspecified chronic kidney disease: Secondary | ICD-10-CM | POA: Diagnosis not present

## 2021-09-22 DIAGNOSIS — N289 Disorder of kidney and ureter, unspecified: Secondary | ICD-10-CM | POA: Diagnosis not present

## 2021-09-22 DIAGNOSIS — Z87891 Personal history of nicotine dependence: Secondary | ICD-10-CM | POA: Diagnosis not present

## 2021-09-22 DIAGNOSIS — I701 Atherosclerosis of renal artery: Secondary | ICD-10-CM | POA: Diagnosis not present

## 2021-09-22 DIAGNOSIS — I743 Embolism and thrombosis of arteries of the lower extremities: Secondary | ICD-10-CM | POA: Diagnosis not present

## 2021-09-22 DIAGNOSIS — R9431 Abnormal electrocardiogram [ECG] [EKG]: Secondary | ICD-10-CM | POA: Diagnosis not present

## 2021-09-22 DIAGNOSIS — I774 Celiac artery compression syndrome: Secondary | ICD-10-CM | POA: Diagnosis not present

## 2021-09-22 DIAGNOSIS — I745 Embolism and thrombosis of iliac artery: Secondary | ICD-10-CM | POA: Diagnosis not present

## 2021-09-22 DIAGNOSIS — I998 Other disorder of circulatory system: Secondary | ICD-10-CM | POA: Diagnosis not present

## 2021-09-22 DIAGNOSIS — I739 Peripheral vascular disease, unspecified: Secondary | ICD-10-CM | POA: Diagnosis not present

## 2021-09-23 DIAGNOSIS — Z87891 Personal history of nicotine dependence: Secondary | ICD-10-CM | POA: Diagnosis not present

## 2021-09-23 DIAGNOSIS — I70221 Atherosclerosis of native arteries of extremities with rest pain, right leg: Secondary | ICD-10-CM | POA: Diagnosis not present

## 2021-09-23 DIAGNOSIS — Z95820 Peripheral vascular angioplasty status with implants and grafts: Secondary | ICD-10-CM | POA: Diagnosis not present

## 2021-09-23 DIAGNOSIS — I10 Essential (primary) hypertension: Secondary | ICD-10-CM | POA: Diagnosis not present

## 2021-09-23 DIAGNOSIS — I70223 Atherosclerosis of native arteries of extremities with rest pain, bilateral legs: Secondary | ICD-10-CM | POA: Diagnosis not present

## 2021-09-23 DIAGNOSIS — I7092 Chronic total occlusion of artery of the extremities: Secondary | ICD-10-CM | POA: Diagnosis not present

## 2021-09-23 DIAGNOSIS — D62 Acute posthemorrhagic anemia: Secondary | ICD-10-CM | POA: Diagnosis not present

## 2021-09-23 DIAGNOSIS — Z79899 Other long term (current) drug therapy: Secondary | ICD-10-CM | POA: Diagnosis not present

## 2021-09-23 DIAGNOSIS — Z01810 Encounter for preprocedural cardiovascular examination: Secondary | ICD-10-CM | POA: Diagnosis not present

## 2021-09-23 DIAGNOSIS — I739 Peripheral vascular disease, unspecified: Secondary | ICD-10-CM | POA: Diagnosis not present

## 2021-09-23 DIAGNOSIS — E785 Hyperlipidemia, unspecified: Secondary | ICD-10-CM | POA: Diagnosis not present

## 2021-09-23 DIAGNOSIS — I70201 Unspecified atherosclerosis of native arteries of extremities, right leg: Secondary | ICD-10-CM | POA: Diagnosis not present

## 2021-09-24 DIAGNOSIS — I70201 Unspecified atherosclerosis of native arteries of extremities, right leg: Secondary | ICD-10-CM | POA: Diagnosis not present

## 2021-09-24 DIAGNOSIS — I10 Essential (primary) hypertension: Secondary | ICD-10-CM | POA: Diagnosis not present

## 2021-09-24 DIAGNOSIS — Z01810 Encounter for preprocedural cardiovascular examination: Secondary | ICD-10-CM | POA: Diagnosis not present

## 2021-09-26 DIAGNOSIS — I739 Peripheral vascular disease, unspecified: Secondary | ICD-10-CM | POA: Diagnosis not present

## 2021-09-26 NOTE — Unmapped External Note (Signed)
 ------------------------------------------------------------------------------- Attestation signed by Missy Oneil Cornet, MD at 09/27/21 1054 I was present during all critical and key portions of the procedure(s) and immediately available to furnish services the entire duration.  See resident note for details. Oneil DELENA Missy, MD   -------------------------------------------------------------------------------  Operative Note (CSN: [id number])   Date of Surgery: 09/26/2021  Pre-op Diagnosis: PAD with rest pain  Post-op Diagnosis: same    [Note: Revisions to procedures should be made in chart - see Procedures tab.]  Bilateral - THROMBOENDARTERECTOMT, WITH OR WITHOUT PATCH GRAFT; COMMON FEMORAL Bilateral - REVASCULARIZE, ENDOVASC, OPEN/PERCUT, ILIAC ARTERY, UNILAT, INITIAL VESSEL; PATTIE CARLS, W/ANGIOPLASTY  Performing Service: Vascular Surgeon(s) and Role:    * Oneil Cornet Missy, MD - Primary    * Laurell GORMAN Jewels, MD - Resident - Assisting    * Thersia Hope, MD - Resident - Assisting  Assistant: None  Anesthesia: General  Estimated Blood Loss: 600 mL  Complications: None  Specimens: None collected  Fluoroscopy time: 17.5 minutes  Contrast: 66 ml omnipaque  Implants: Gore 11 x 29 vbx left CIA Gore 10 x 59 VBX right CIA Gore 10 x 10 viabahn right EIA 2 x Cook zilver 10 x 60 left EIA  Findings: Successful bilateral femoral endarterectomy bilateral CFA and proximal SFA and proximal profunda with profundaplasty/patch angioplasty. Successful bilateral iliac stenting common and external.  Bilateral DP and PT signals at case completion  Procedure: Prior to procedure consent was obtained as documented separately. Patient was identified, transported to OR, timeout performed. GETA induced and lines/monitors placed per anesthesia. The abdomen and bilateral groins were prepped and draped in sterile fashion. A second timeout was performed.   Incision was made in bilateral  groins longitudinally over the femoral arteries. Dissection was carried down simultaneously down to the femoral sheath with cautery. The Femoral sheath was entered sharply bilaterally and the CFA, PFA, SFA were circumferentially dissected and controlled with vessel loops as well as small side branches which were preserved. Dissection was carried up to the distal EIA bilaterally. The patient was heparinized and the right side controlled with vascular clamps. Arteriotomy was made and extended down onto SFA origin as the PFA was directly posterior. Endarterectomy was then performed with feathered endpoint achieved in the PFA and the SFA had a soft plaque which was tacked up later as the patch was sewn. The bovine pericardial patch was then brought onto the field and sewn with two running 5-0 prolene. The patch was not tied down and an 8 fr sheath was placed through the suture line.   The endarterectomy was completed on the left side in similar fashion; ultimately a feathered endpoint was achieved in the profunda and there was an edge which was tacked down posteriorly in the SFA with interrupted 6-0 prolene. The patch was then sewn in place with 5-0 prolene running x 2 and tied down. The patch was accessed and a 6 fr sheath placed. On the left side an angiogram was performed demonstrating extensive left EIA atherosclerotic disease with 90% stenosis to left distal EIA short segment. This was crossed and the aorta accessed and angiogram confirmed access to true lumen. A rosen wire was parked and 8 fr x 25 cm sheath advanced.   Next the right EIA angiogram demonstrated complete occlusion of EIA and CIA. The lesion was crossed with KMP and glidewire and 8 fr sheath. A subintimal plane was entered in the EIA but true lumen re-entered below the aortic bifurcation. Angiogram confirmed access to aortic lumen.  An amplatz wire was parked and 8 fr x 25 cm sheath advanced into position.   Next a bilateral iliac arteriogram  and distal aortogram was performed. The location of the hypogastric on the left was marked out. An 11 x 39 VBX was advanced on the left side and a 10 x 59 VBX was advanced on the right side. These were positioned and simultaneously deployed then post dilated with right 10 mm and left 12 mm balloon. The stents were then extended on the right with 10 x 10 viabahn and on the left with two 10 mm x 60 mm zilver. These were post dilated with a 10 mm balloon. Completion bilateral iliac angiogram demonstrated minimal residual stenosis to bilateral EIA, widely patent bilateral CIA stents. The sheaths were removed and patch tied down on the right and repaired on the left. Hemostasis was achieved and there were good signals in bilateral PFA and CFA (both SFAs are known to be occluded in mid thigh based on preoperative duplex). Protamine was administered. The groin incisions were irrigated and closed in layers with 3-0 vicryl and skin staples and sterile dressings were applied.   All counts were correct. Patient tolerated the procedure well.   Surgeon Notes: Dr Missy was present and scrubbed for the entire procedure  Laurell GORMAN Jewels  Date: 09/26/2021  Time: 2:12 PM

## 2021-09-29 NOTE — Discharge Summary (Signed)
 ------------------------------------------------------------------------------- Attestation signed by Missy Oneil Cornet, MD at 10/02/21 0802 I was available.  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 *   OR Procedures:   Bilateral - THROMBOENDARTERECTOMT, WITH OR WITHOUT PATCH GRAFT; COMMON FEMORAL Bilateral - REVASCULARIZE, ENDOVASC, OPEN/PERCUT, ILIAC ARTERY, UNILAT, INITIAL VESSEL; W/TRANSLUM STENT, W/ANGIOPLASTY Date 09/26/2021 -------------------   Ancillary Procedures: no procedures  Discharge Day Services: The patient was seen on the day of discharge by the Vascular Surgery team. VS and assessments were stable. All discharge instructions were reviewed and all questions were answered. Incision care was taught and reviewed by the nursing staff prior to discharge. See below for assessment specifics.    Subjective  No acute events overnight. Pain Controlled. No fever or chills. Hgb increase to 8.8 after transfusion yesterday and he is clinically stable and recovering well. Reports rest pain is gone and was able to walk yesterday with no pain.   Objective  Patient Vitals for the past 8 hrs:  BP Temp Temp src Pulse Resp SpO2  09/29/21 1129 143/67 37.2 C (99 F) Oral 87 18 96 %  09/29/21 0853 124/59 37.2 C (99 F) Oral 86 16 98 %   I/O this shift: In: 350 [P.O.:350] Out: 500 [Urine:500]  General Appearance:   No acute distress Lungs:                Clear to auscultation bilaterally Heart:                            Regular rate and rhythm Abdomen:                Soft, non-tender, non-distended Extremities:              No wounds on bilateral lower extremities. Right: PT subtle monophasic signal; right DP without signal. Left: PT/DP with 2+ signal  Hospital Course:  Mr. Noah Nguyen, is a 81 y.o. male with PMHx of HTN, PAD admitted from OSH on 09/23/2021 for RLE rest pain. He is likely experiencing rest pain related to poor arterial flow to BLE from the iliac arteries as evidenced by CTA and ABI obtained at OSH. Arterial duplex performed 09/23/2021 shows extensive partially occlusive disease in the BLE. ECHO on 08/06 shows EF of >55% with mild mitral regurgitation and moderate tricuspid regurgitation.   He was taken to the OR on 09/26/2021 where he underwent bilateral CFA endarterectomy with patch angioplasty, biateral CIA and EIA stenting. The procedure itself was uneventful and without complications. He tolerated the procedure well, was extubated in the OR, and received routine post-operative care before being transferred to the floor. Patient had a stable post-operative course with good pain control and hemostatic incisions. The patient reports improved rest pain post-operatively. Left distal pulses are found with doppler, but right PT is subtle, monophasic and right DP has no doppler signals. Overnight on 09/27/2021 he was noted to have bleeding from his right groin, saturating 3 thick dressings, which resolved with quick clot gauze and a prevena wound vac was placed on 09/28/2021. His hemoglobin was noted to be  7.1 on CBC on 8/10, decreased from 8.1 on POD #1 which was thought to be related to post-operative bleeding from his groin. He received 1 unit of pRBCs on 8/10, and his Hgb on 8/11 was 8.8.   PVL arterial duplex on 09/28/2021 demonstrated improved inflow but right persistent SFA and/or popliteal artery obstruction with RT great toe pressure of 0 mmHg and left persistent arterial obstruction of the  tibioperoneal vessels with LT great toe pressure of 22 mmHg. This indicates he may benefit from further revascularization at a later time.  On the day of discharge he was recovering well, ambulating independently, pain was well-controlled with PO pain medications, and was decided to be ready for discharge to home. Follow-up appointment with Dr. Missy was requested prior to discharge.  Condition at Discharge: Improved Discharge Medications:    Medication List    START taking these medications   . aspirin 81 MG chewable tablet; Chew 1 tablet (81 mg total) daily. . clopidogreL 75 mg tablet; Commonly known as: PLAVIX; Take 1 tablet (75  mg total) by mouth daily.   CHANGE how you take these medications   . atorvastatin 80 MG tablet; Commonly known as: LIPITOR; Take 1 tablet (80  mg total) by mouth daily.; What changed: medication strength, how much to  take   CONTINUE taking these medications   . amLODIPine 10 MG tablet; Commonly known as: NORVASC . cloNIDine HCL 0.1 MG tablet; Commonly known as: CATAPRES . hydrALAZINE 100 MG tablet; Commonly known as: APRESOLINE . hydroCHLOROthiazide 25 MG tablet; Commonly known as: HYDRODIURIL    Pending Test Results: None  Discharge Instructions:   Other Instructions: Other Instructions     Discharge instructions     1) Dial 911 for emergencies.    2) Mon-Fri, 8am-4pm, call the Vascular Surgery clinic at [phone] for:   - increasing pain in legs or feet   - development of skin breakdown or open sores on the legs or feet   - fever > 101 F   - signs or symptoms of infection at incision site such as increased redness, swelling, pain, or drainage   3) For emergencies after-hours: call the [facility] operator [phone]) to page the General Surgery resident on call (your question will be directed to a surgery resident who is not immediately aware of the details of your case, but can help you deal with any emergencies that cannot wait  until regular business hours). Please contact for and go to the nearest emergency room for:   - new or sudden inability to move legs or feet   - blue / purple discoloration of the leg, foot, or toes.   4) You may shower 48 hours after surgery, do not scrub surgical sites. Any staples will be removed in clinic and any surgical glue will dissolve over time, you may trim any pieces that become lose   5) You should take Tylenol 650-1000mg  every 6-8 hours as needed for pain. If your pain is not controlled, you have been prescribed a narcotic pain medication if necessary. Do not drive while taking narcotic pain medications.   6)  Some of your home medications may have CHANGED, please review list carefully. Resume home medications as listed under discharge medications.  For your Right Provena Wound Vac:  1) Patient should NOT get dressing or wound vac machine wet and keep wound covered.    2) If wound vac dressing becomes wet, if area around wound becomes hot, red, or swollen, if  wound vac alarms 'low suction for >2hours - take wound vac dressing off - apply a dry gauze dressing - call the Vascular clinic  3) You SHOULD take the wound vac off after 7 days (10/05/2021)      Labs and Other Follow-ups after Discharge: Follow Up instructions and Outpatient Referrals    Discharge instructions      Resources and Referrals   Home health has been set up for through the agency listed below. The Home health agency will be contacting you to set up a time for them to come see you in your home within 2 days of your discharge.  If you have not heard from them prior to 09/30/21 or you have any questions about home health, please contact them at the phone number listed below.  [facility] - Admitted Since 09/23/2021     Service Provider Selected Services Address Phone Fax Patient Preferred   [facility] [address], [place] [address] [phone]  [phone] --             Future Appointments: With Dr. Missy, appointment date/time pending. The vascular office will contact the patient. Appointments which have been scheduled for you    Oct 26, 2021  8:00 AM (Arrive by 7:30 AM) PVL ARTERIAL DUPLEX LOWER EXTREMITY BILATERAL with [facility] PVL OUTPATIENT 1 IMG PVL [facility]) [address] [place] [address] [phone]     Oct 26, 2021  9:30 AM (Arrive by 9:00 AM) RETURN  GENERAL with Oneil Juliene Missy, MD [facility] VASCULAR SURGERY [place] [facility] REGION) [address] [place] [address] [phone]

## 2021-10-01 DIAGNOSIS — Z48812 Encounter for surgical aftercare following surgery on the circulatory system: Secondary | ICD-10-CM | POA: Diagnosis not present

## 2021-10-01 DIAGNOSIS — R32 Unspecified urinary incontinence: Secondary | ICD-10-CM | POA: Diagnosis not present

## 2021-10-01 DIAGNOSIS — I10 Essential (primary) hypertension: Secondary | ICD-10-CM | POA: Diagnosis not present

## 2021-10-01 DIAGNOSIS — E785 Hyperlipidemia, unspecified: Secondary | ICD-10-CM | POA: Diagnosis not present

## 2021-10-01 DIAGNOSIS — Z87891 Personal history of nicotine dependence: Secondary | ICD-10-CM | POA: Diagnosis not present

## 2021-10-01 DIAGNOSIS — Z7902 Long term (current) use of antithrombotics/antiplatelets: Secondary | ICD-10-CM | POA: Diagnosis not present

## 2021-10-01 DIAGNOSIS — Z7982 Long term (current) use of aspirin: Secondary | ICD-10-CM | POA: Diagnosis not present

## 2021-10-01 DIAGNOSIS — I70223 Atherosclerosis of native arteries of extremities with rest pain, bilateral legs: Secondary | ICD-10-CM | POA: Diagnosis not present

## 2021-10-05 DIAGNOSIS — E785 Hyperlipidemia, unspecified: Secondary | ICD-10-CM | POA: Diagnosis not present

## 2021-10-05 DIAGNOSIS — Z95820 Peripheral vascular angioplasty status with implants and grafts: Secondary | ICD-10-CM | POA: Diagnosis not present

## 2021-10-05 DIAGNOSIS — I10 Essential (primary) hypertension: Secondary | ICD-10-CM | POA: Diagnosis not present

## 2021-10-05 DIAGNOSIS — I739 Peripheral vascular disease, unspecified: Secondary | ICD-10-CM | POA: Diagnosis not present

## 2021-10-05 DIAGNOSIS — N183 Chronic kidney disease, stage 3 unspecified: Secondary | ICD-10-CM | POA: Diagnosis not present

## 2021-10-06 DIAGNOSIS — I10 Essential (primary) hypertension: Secondary | ICD-10-CM | POA: Diagnosis not present

## 2021-10-06 DIAGNOSIS — N183 Chronic kidney disease, stage 3 unspecified: Secondary | ICD-10-CM | POA: Diagnosis not present

## 2021-10-26 DIAGNOSIS — Z09 Encounter for follow-up examination after completed treatment for conditions other than malignant neoplasm: Secondary | ICD-10-CM | POA: Diagnosis not present

## 2021-10-26 DIAGNOSIS — I70209 Unspecified atherosclerosis of native arteries of extremities, unspecified extremity: Secondary | ICD-10-CM | POA: Diagnosis not present

## 2021-11-05 DIAGNOSIS — N183 Chronic kidney disease, stage 3 unspecified: Secondary | ICD-10-CM | POA: Diagnosis not present

## 2021-11-05 DIAGNOSIS — I10 Essential (primary) hypertension: Secondary | ICD-10-CM | POA: Diagnosis not present

## 2021-12-05 DIAGNOSIS — N183 Chronic kidney disease, stage 3 unspecified: Secondary | ICD-10-CM | POA: Diagnosis not present

## 2021-12-05 DIAGNOSIS — I10 Essential (primary) hypertension: Secondary | ICD-10-CM | POA: Diagnosis not present

## 2021-12-21 DIAGNOSIS — I739 Peripheral vascular disease, unspecified: Secondary | ICD-10-CM | POA: Diagnosis not present

## 2021-12-21 DIAGNOSIS — I10 Essential (primary) hypertension: Secondary | ICD-10-CM | POA: Diagnosis not present

## 2021-12-21 DIAGNOSIS — F17210 Nicotine dependence, cigarettes, uncomplicated: Secondary | ICD-10-CM | POA: Diagnosis not present

## 2021-12-21 DIAGNOSIS — Z95828 Presence of other vascular implants and grafts: Secondary | ICD-10-CM | POA: Diagnosis not present

## 2021-12-21 DIAGNOSIS — Z79899 Other long term (current) drug therapy: Secondary | ICD-10-CM | POA: Diagnosis not present

## 2021-12-21 DIAGNOSIS — Z7982 Long term (current) use of aspirin: Secondary | ICD-10-CM | POA: Diagnosis not present

## 2022-01-05 DIAGNOSIS — I10 Essential (primary) hypertension: Secondary | ICD-10-CM | POA: Diagnosis not present

## 2022-01-05 DIAGNOSIS — E785 Hyperlipidemia, unspecified: Secondary | ICD-10-CM | POA: Diagnosis not present

## 2022-02-01 DIAGNOSIS — F17200 Nicotine dependence, unspecified, uncomplicated: Secondary | ICD-10-CM | POA: Diagnosis not present

## 2022-02-01 DIAGNOSIS — Z23 Encounter for immunization: Secondary | ICD-10-CM | POA: Diagnosis not present

## 2022-02-01 DIAGNOSIS — N183 Chronic kidney disease, stage 3 unspecified: Secondary | ICD-10-CM | POA: Diagnosis not present

## 2022-02-01 DIAGNOSIS — Z1331 Encounter for screening for depression: Secondary | ICD-10-CM | POA: Diagnosis not present

## 2022-02-01 DIAGNOSIS — I10 Essential (primary) hypertension: Secondary | ICD-10-CM | POA: Diagnosis not present

## 2022-02-01 DIAGNOSIS — Z95820 Peripheral vascular angioplasty status with implants and grafts: Secondary | ICD-10-CM | POA: Diagnosis not present

## 2022-02-01 DIAGNOSIS — Z0001 Encounter for general adult medical examination with abnormal findings: Secondary | ICD-10-CM | POA: Diagnosis not present

## 2022-02-01 DIAGNOSIS — E785 Hyperlipidemia, unspecified: Secondary | ICD-10-CM | POA: Diagnosis not present

## 2022-02-01 DIAGNOSIS — Z1389 Encounter for screening for other disorder: Secondary | ICD-10-CM | POA: Diagnosis not present

## 2022-02-01 DIAGNOSIS — I739 Peripheral vascular disease, unspecified: Secondary | ICD-10-CM | POA: Diagnosis not present

## 2022-03-04 DIAGNOSIS — I10 Essential (primary) hypertension: Secondary | ICD-10-CM | POA: Diagnosis not present

## 2022-03-04 DIAGNOSIS — E785 Hyperlipidemia, unspecified: Secondary | ICD-10-CM | POA: Diagnosis not present

## 2022-03-12 DIAGNOSIS — N1831 Chronic kidney disease, stage 3a: Secondary | ICD-10-CM | POA: Diagnosis not present

## 2022-03-23 DIAGNOSIS — D649 Anemia, unspecified: Secondary | ICD-10-CM | POA: Diagnosis not present

## 2022-03-23 DIAGNOSIS — I129 Hypertensive chronic kidney disease with stage 1 through stage 4 chronic kidney disease, or unspecified chronic kidney disease: Secondary | ICD-10-CM | POA: Diagnosis not present

## 2022-03-23 DIAGNOSIS — N1831 Chronic kidney disease, stage 3a: Secondary | ICD-10-CM | POA: Diagnosis not present

## 2022-04-04 DIAGNOSIS — E785 Hyperlipidemia, unspecified: Secondary | ICD-10-CM | POA: Diagnosis not present

## 2022-04-04 DIAGNOSIS — I10 Essential (primary) hypertension: Secondary | ICD-10-CM | POA: Diagnosis not present

## 2022-05-03 DIAGNOSIS — I10 Essential (primary) hypertension: Secondary | ICD-10-CM | POA: Diagnosis not present

## 2022-05-03 DIAGNOSIS — E785 Hyperlipidemia, unspecified: Secondary | ICD-10-CM | POA: Diagnosis not present

## 2022-06-03 DIAGNOSIS — E785 Hyperlipidemia, unspecified: Secondary | ICD-10-CM | POA: Diagnosis not present

## 2022-06-03 DIAGNOSIS — I10 Essential (primary) hypertension: Secondary | ICD-10-CM | POA: Diagnosis not present

## 2022-07-03 DIAGNOSIS — E785 Hyperlipidemia, unspecified: Secondary | ICD-10-CM | POA: Diagnosis not present

## 2022-07-03 DIAGNOSIS — I10 Essential (primary) hypertension: Secondary | ICD-10-CM | POA: Diagnosis not present

## 2022-08-08 DIAGNOSIS — Z23 Encounter for immunization: Secondary | ICD-10-CM | POA: Diagnosis not present

## 2022-08-08 DIAGNOSIS — I10 Essential (primary) hypertension: Secondary | ICD-10-CM | POA: Diagnosis not present

## 2022-08-08 DIAGNOSIS — M720 Palmar fascial fibromatosis [Dupuytren]: Secondary | ICD-10-CM | POA: Diagnosis not present

## 2022-08-08 DIAGNOSIS — Z0001 Encounter for general adult medical examination with abnormal findings: Secondary | ICD-10-CM | POA: Diagnosis not present

## 2022-08-08 DIAGNOSIS — E785 Hyperlipidemia, unspecified: Secondary | ICD-10-CM | POA: Diagnosis not present

## 2022-08-08 DIAGNOSIS — I739 Peripheral vascular disease, unspecified: Secondary | ICD-10-CM | POA: Diagnosis not present

## 2022-08-29 DIAGNOSIS — H5203 Hypermetropia, bilateral: Secondary | ICD-10-CM | POA: Diagnosis not present

## 2022-08-29 DIAGNOSIS — H524 Presbyopia: Secondary | ICD-10-CM | POA: Diagnosis not present

## 2022-09-07 DIAGNOSIS — I10 Essential (primary) hypertension: Secondary | ICD-10-CM | POA: Diagnosis not present

## 2022-09-07 DIAGNOSIS — E785 Hyperlipidemia, unspecified: Secondary | ICD-10-CM | POA: Diagnosis not present

## 2022-09-25 DIAGNOSIS — I129 Hypertensive chronic kidney disease with stage 1 through stage 4 chronic kidney disease, or unspecified chronic kidney disease: Secondary | ICD-10-CM | POA: Diagnosis not present

## 2022-09-25 DIAGNOSIS — E785 Hyperlipidemia, unspecified: Secondary | ICD-10-CM | POA: Diagnosis not present

## 2022-09-25 DIAGNOSIS — N1831 Chronic kidney disease, stage 3a: Secondary | ICD-10-CM | POA: Diagnosis not present

## 2022-09-25 DIAGNOSIS — D649 Anemia, unspecified: Secondary | ICD-10-CM | POA: Diagnosis not present

## 2022-10-08 DIAGNOSIS — I10 Essential (primary) hypertension: Secondary | ICD-10-CM | POA: Diagnosis not present

## 2022-10-08 DIAGNOSIS — E785 Hyperlipidemia, unspecified: Secondary | ICD-10-CM | POA: Diagnosis not present

## 2022-11-08 DIAGNOSIS — I10 Essential (primary) hypertension: Secondary | ICD-10-CM | POA: Diagnosis not present

## 2022-11-08 DIAGNOSIS — E785 Hyperlipidemia, unspecified: Secondary | ICD-10-CM | POA: Diagnosis not present

## 2022-12-08 DIAGNOSIS — E785 Hyperlipidemia, unspecified: Secondary | ICD-10-CM | POA: Diagnosis not present

## 2022-12-08 DIAGNOSIS — I10 Essential (primary) hypertension: Secondary | ICD-10-CM | POA: Diagnosis not present

## 2023-01-08 DIAGNOSIS — I10 Essential (primary) hypertension: Secondary | ICD-10-CM | POA: Diagnosis not present

## 2023-01-08 DIAGNOSIS — E785 Hyperlipidemia, unspecified: Secondary | ICD-10-CM | POA: Diagnosis not present

## 2023-01-31 DIAGNOSIS — Z0001 Encounter for general adult medical examination with abnormal findings: Secondary | ICD-10-CM | POA: Diagnosis not present

## 2023-01-31 DIAGNOSIS — Z1389 Encounter for screening for other disorder: Secondary | ICD-10-CM | POA: Diagnosis not present

## 2023-01-31 DIAGNOSIS — I739 Peripheral vascular disease, unspecified: Secondary | ICD-10-CM | POA: Diagnosis not present

## 2023-01-31 DIAGNOSIS — Z95820 Peripheral vascular angioplasty status with implants and grafts: Secondary | ICD-10-CM | POA: Diagnosis not present

## 2023-01-31 DIAGNOSIS — I10 Essential (primary) hypertension: Secondary | ICD-10-CM | POA: Diagnosis not present

## 2023-01-31 DIAGNOSIS — E785 Hyperlipidemia, unspecified: Secondary | ICD-10-CM | POA: Diagnosis not present

## 2023-01-31 DIAGNOSIS — N183 Chronic kidney disease, stage 3 unspecified: Secondary | ICD-10-CM | POA: Diagnosis not present

## 2023-01-31 DIAGNOSIS — Z1331 Encounter for screening for depression: Secondary | ICD-10-CM | POA: Diagnosis not present

## 2023-03-03 DIAGNOSIS — I10 Essential (primary) hypertension: Secondary | ICD-10-CM | POA: Diagnosis not present

## 2023-03-03 DIAGNOSIS — E785 Hyperlipidemia, unspecified: Secondary | ICD-10-CM | POA: Diagnosis not present

## 2023-04-30 DIAGNOSIS — D649 Anemia, unspecified: Secondary | ICD-10-CM | POA: Diagnosis not present

## 2023-04-30 DIAGNOSIS — I129 Hypertensive chronic kidney disease with stage 1 through stage 4 chronic kidney disease, or unspecified chronic kidney disease: Secondary | ICD-10-CM | POA: Diagnosis not present

## 2023-04-30 DIAGNOSIS — N1831 Chronic kidney disease, stage 3a: Secondary | ICD-10-CM | POA: Diagnosis not present

## 2023-05-01 DIAGNOSIS — N1831 Chronic kidney disease, stage 3a: Secondary | ICD-10-CM | POA: Diagnosis not present

## 2023-06-25 DIAGNOSIS — I10 Essential (primary) hypertension: Secondary | ICD-10-CM | POA: Diagnosis not present

## 2023-06-25 DIAGNOSIS — E785 Hyperlipidemia, unspecified: Secondary | ICD-10-CM | POA: Diagnosis not present

## 2023-07-31 DIAGNOSIS — E785 Hyperlipidemia, unspecified: Secondary | ICD-10-CM | POA: Diagnosis not present

## 2023-07-31 DIAGNOSIS — Z95820 Peripheral vascular angioplasty status with implants and grafts: Secondary | ICD-10-CM | POA: Diagnosis not present

## 2023-07-31 DIAGNOSIS — I10 Essential (primary) hypertension: Secondary | ICD-10-CM | POA: Diagnosis not present

## 2023-08-30 DIAGNOSIS — E785 Hyperlipidemia, unspecified: Secondary | ICD-10-CM | POA: Diagnosis not present

## 2023-08-30 DIAGNOSIS — I10 Essential (primary) hypertension: Secondary | ICD-10-CM | POA: Diagnosis not present

## 2023-09-02 DIAGNOSIS — N1831 Chronic kidney disease, stage 3a: Secondary | ICD-10-CM | POA: Diagnosis not present

## 2023-09-09 DIAGNOSIS — N1831 Chronic kidney disease, stage 3a: Secondary | ICD-10-CM | POA: Diagnosis not present

## 2023-09-09 DIAGNOSIS — D649 Anemia, unspecified: Secondary | ICD-10-CM | POA: Diagnosis not present

## 2023-09-09 DIAGNOSIS — I129 Hypertensive chronic kidney disease with stage 1 through stage 4 chronic kidney disease, or unspecified chronic kidney disease: Secondary | ICD-10-CM | POA: Diagnosis not present

## 2023-09-30 DIAGNOSIS — E785 Hyperlipidemia, unspecified: Secondary | ICD-10-CM | POA: Diagnosis not present

## 2023-09-30 DIAGNOSIS — I10 Essential (primary) hypertension: Secondary | ICD-10-CM | POA: Diagnosis not present

## 2023-10-31 DIAGNOSIS — E785 Hyperlipidemia, unspecified: Secondary | ICD-10-CM | POA: Diagnosis not present

## 2023-10-31 DIAGNOSIS — I10 Essential (primary) hypertension: Secondary | ICD-10-CM | POA: Diagnosis not present

## 2023-12-23 DIAGNOSIS — N1831 Chronic kidney disease, stage 3a: Secondary | ICD-10-CM | POA: Diagnosis not present

## 2024-01-02 DIAGNOSIS — D649 Anemia, unspecified: Secondary | ICD-10-CM | POA: Diagnosis not present

## 2024-01-02 DIAGNOSIS — N1831 Chronic kidney disease, stage 3a: Secondary | ICD-10-CM | POA: Diagnosis not present

## 2024-01-02 DIAGNOSIS — I129 Hypertensive chronic kidney disease with stage 1 through stage 4 chronic kidney disease, or unspecified chronic kidney disease: Secondary | ICD-10-CM | POA: Diagnosis not present

## 2024-01-31 IMAGING — US US RENAL
1 series · 14 of 25 positions shown · non-contrast
Comparison: None

CLINICAL DATA: Stage IIIA CKD

EXAM:
RENAL / URINARY TRACT ULTRASOUND COMPLETE

[Series 1: us renal · 14 of 50 slices shown]
[im 1/50]
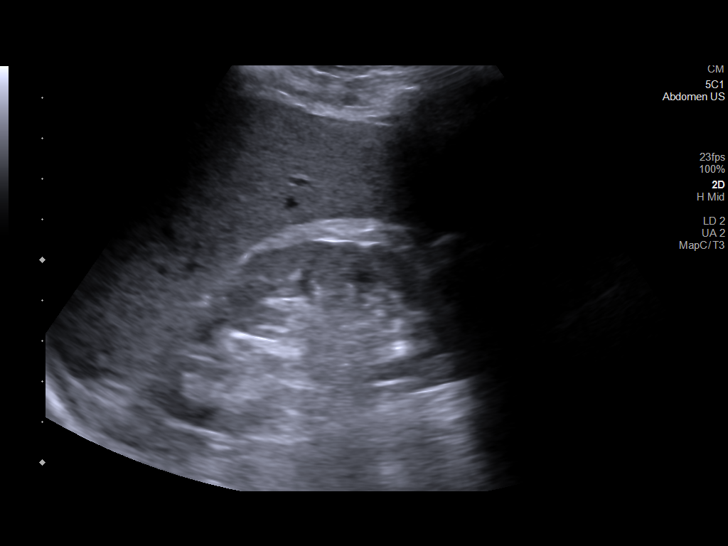
[im 5/50]
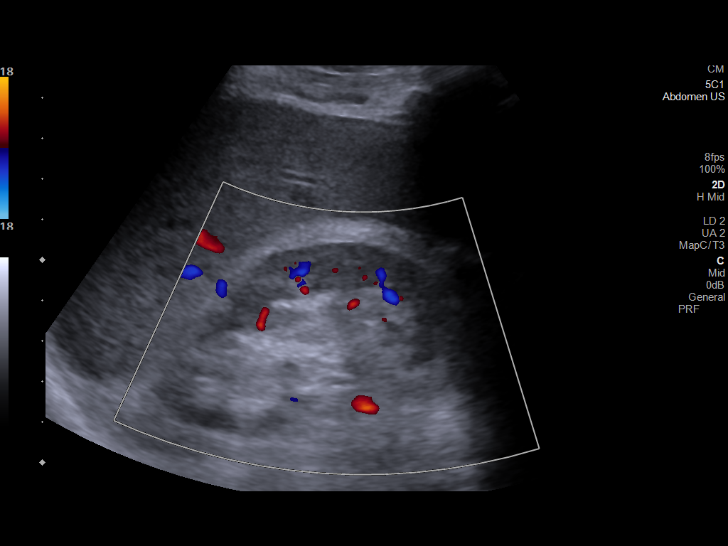
[im 9/50]
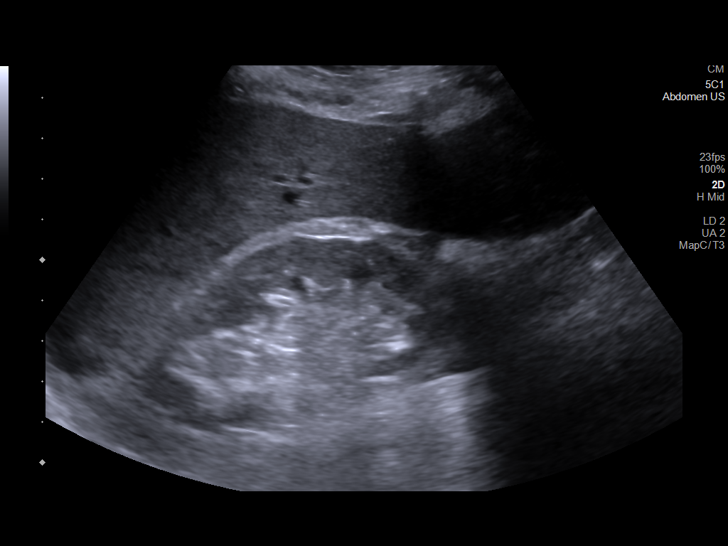
[im 13/50]
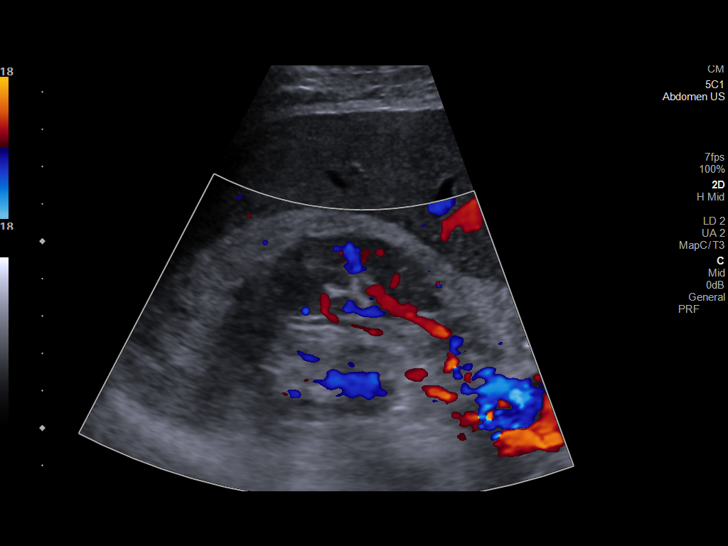
[im 17/50]
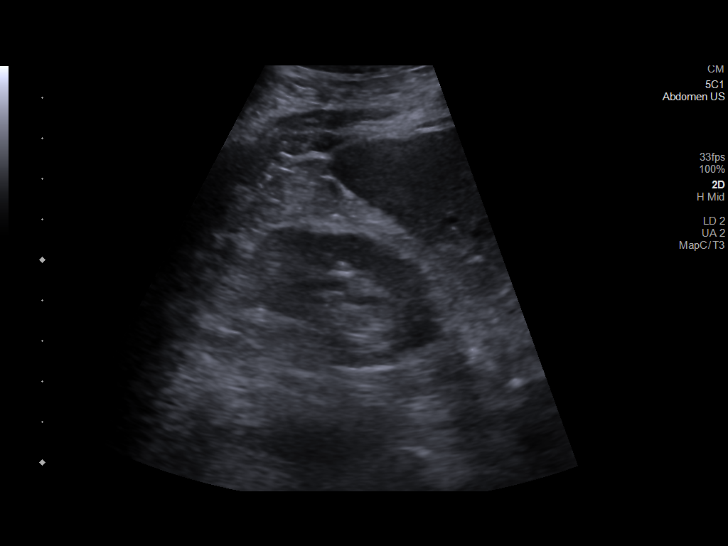
[im 19/50]
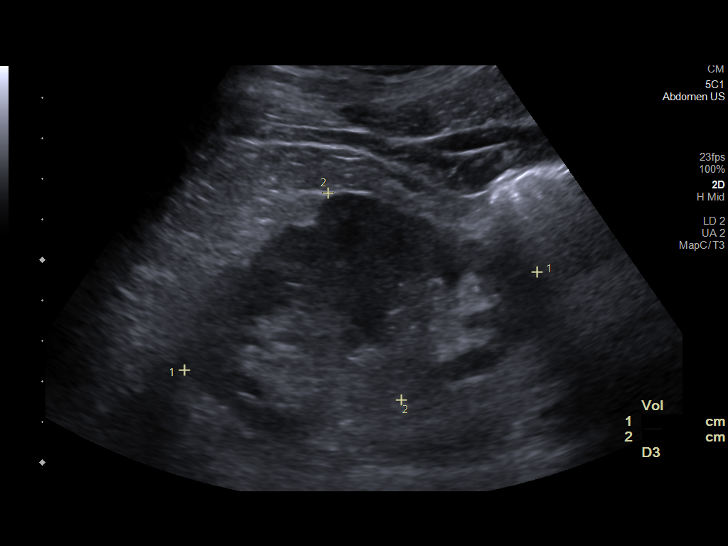
[im 23/50]
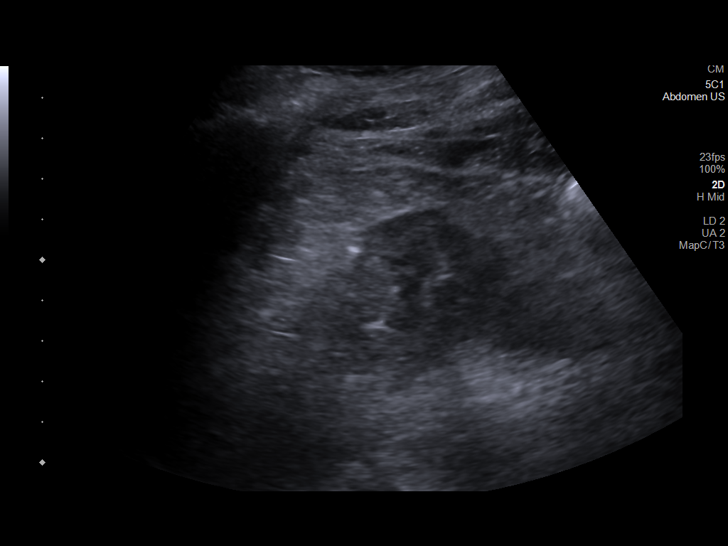
[im 27/50]
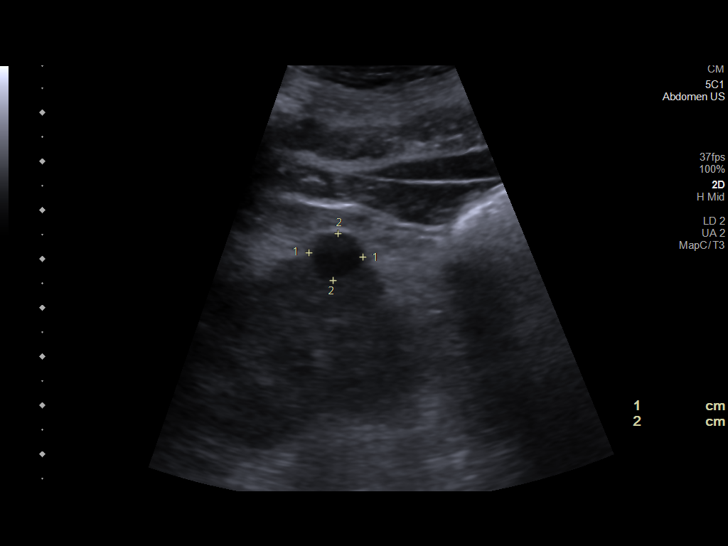
[im 31/50]
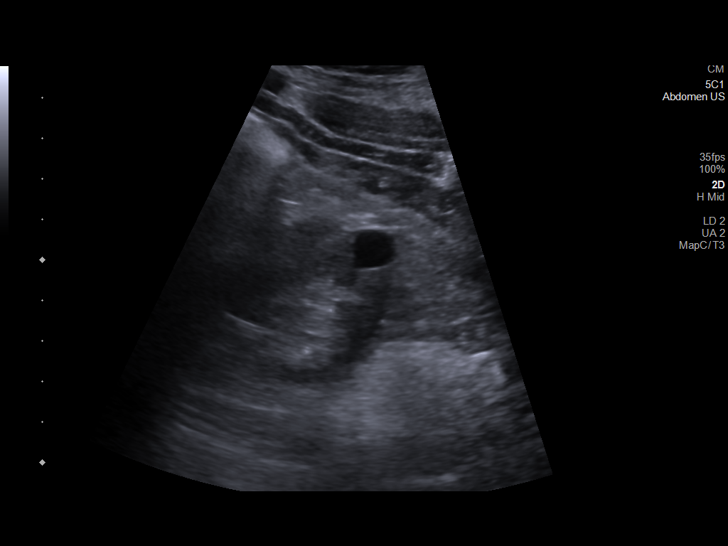
[im 33/50]
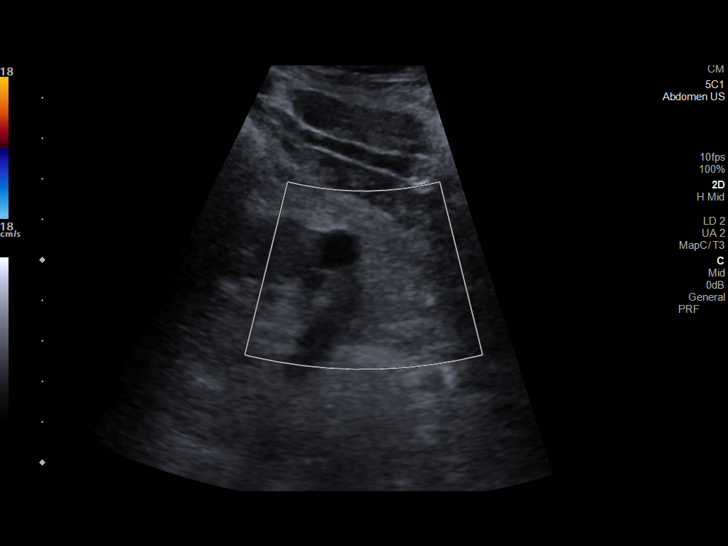
[im 37/50]
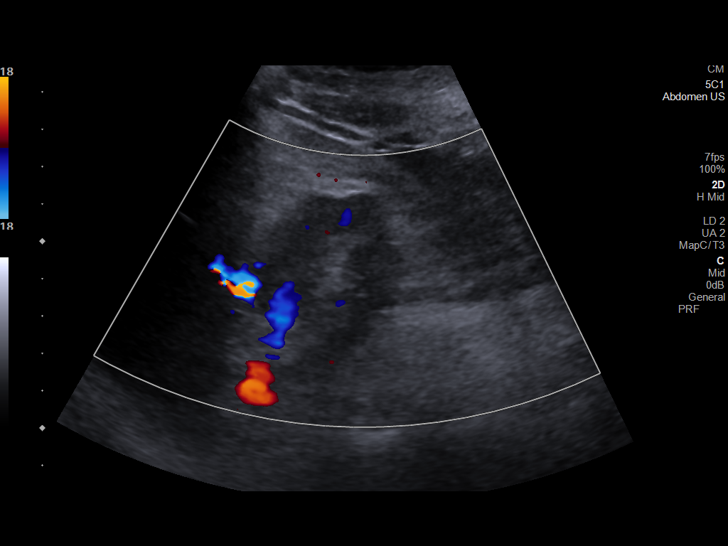
[im 41/50]
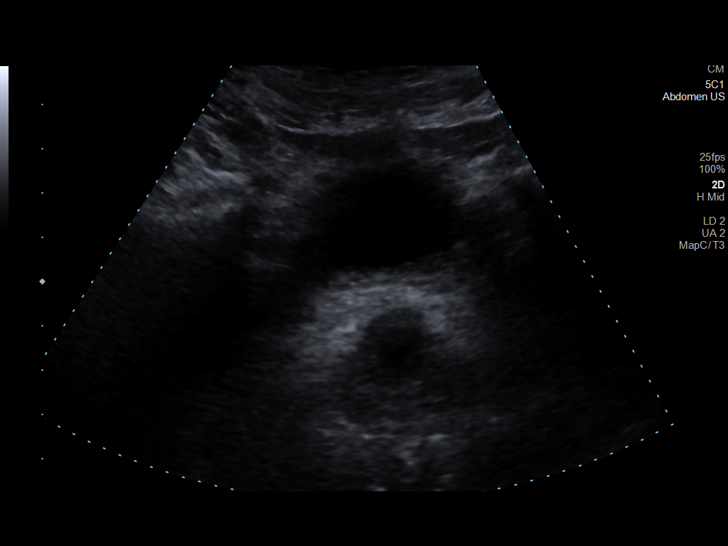
[im 45/50]
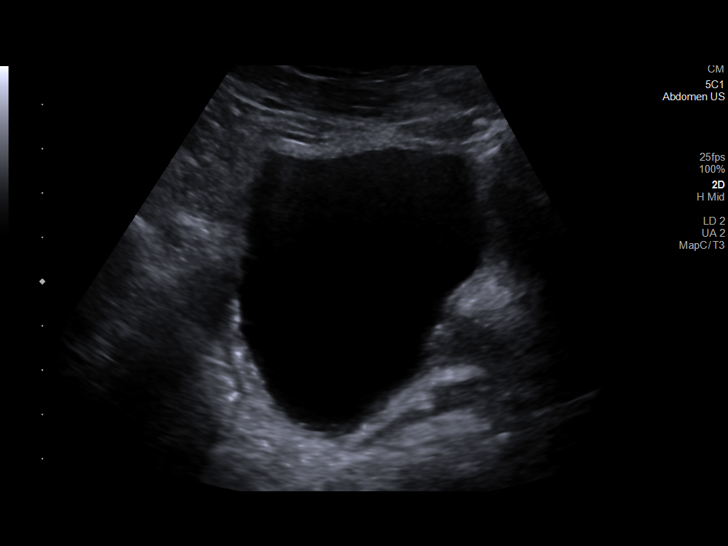
[im 50/50]
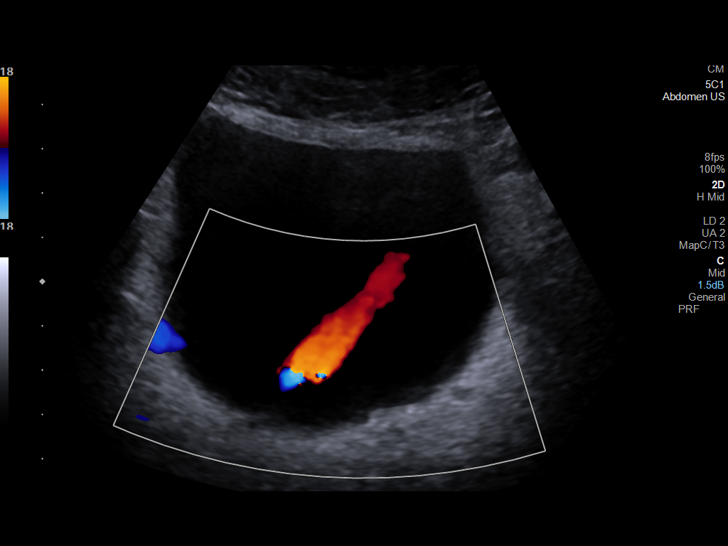

[14 of 25 positions shown; findings below may reference images not displayed]

FINDINGS: Right Kidney:

Renal measurements: 8.2 x 4.1 x 5.4 cm = volume: 95 mL. Echogenicity
within normal limits. No mass or hydronephrosis visualized.

Left Kidney:

Renal measurements: 9.0 x 5.4 x 4.3 cm = volume: 111 mL.
Echogenicity within normal limits. No mass or hydronephrosis
visualized. 1.1 cm simple cyst present in the lower pole the left
kidney.

Bladder:

Appears normal for degree of bladder distention.

Other:

None.
IMPRESSION: No significant sonographic abnormality of the kidneys.
